# Patient Record
Sex: Male | Born: 1935 | Marital: Single
Health system: Southern US, Community
[De-identification: ages and names within clinical notes are randomized; demographics above are authoritative.]

---

## 2005-07-19 ENCOUNTER — Inpatient Hospital Stay: Payer: Self-pay | Admitting: Internal Medicine

## 2005-07-19 ENCOUNTER — Other Ambulatory Visit: Payer: Self-pay

## 2006-09-06 ENCOUNTER — Emergency Department: Payer: Self-pay | Admitting: Emergency Medicine

## 2008-09-03 ENCOUNTER — Ambulatory Visit: Payer: Self-pay | Admitting: Dermatology

## 2012-02-05 ENCOUNTER — Emergency Department: Payer: Self-pay | Admitting: Emergency Medicine

## 2012-02-05 LAB — ETHANOL
Ethanol %: <0.003 % (ref 0.000–0.080)
Ethanol: <3 mg/dL

## 2012-02-05 LAB — COMPREHENSIVE METABOLIC PANEL
Albumin: 3.9 g/dL (ref 3.4–5.0)
Alkaline Phosphatase: 107 U/L (ref 50–136)
Anion Gap: 7 (ref 7–16)
BUN: 11 mg/dL (ref 7–18)
Bilirubin,Total: 0.6 mg/dL (ref 0.2–1.0)
Calcium, Total: 9.1 mg/dL (ref 8.5–10.1)
Co2: 31 mmol/L (ref 21–32)
EGFR (African American): 56 — ABNORMAL LOW
EGFR (Non-African Amer.): 49 — ABNORMAL LOW
Osmolality: 277 (ref 275–301)
SGPT (ALT): 22 U/L
Total Protein: 7.7 g/dL (ref 6.4–8.2)

## 2012-02-05 LAB — CBC
HCT: 42.7 % (ref 40.0–52.0)
HGB: 14.2 g/dL (ref 13.0–18.0)
MCV: 98 fL (ref 80–100)
Platelet: 221 10*3/uL (ref 150–440)
RBC: 4.37 10*6/uL — ABNORMAL LOW (ref 4.40–5.90)
RDW: 13.8 % (ref 11.5–14.5)
WBC: 9.1 10*3/uL (ref 3.8–10.6)

## 2012-02-05 LAB — URINALYSIS, COMPLETE
Bilirubin,UR: NEGATIVE
Blood: NEGATIVE
Ketone: NEGATIVE
Leukocyte Esterase: NEGATIVE
Nitrite: NEGATIVE
Protein: NEGATIVE
Squamous Epithelial: NONE SEEN
WBC UR: 1 /HPF (ref 0–5)

## 2012-02-05 LAB — DRUG SCREEN, URINE
Amphetamines, Ur Screen: NEGATIVE (ref ?–1000)
Barbiturates, Ur Screen: NEGATIVE (ref ?–200)
MDMA (Ecstasy)Ur Screen: NEGATIVE (ref ?–500)
Methadone, Ur Screen: NEGATIVE (ref ?–300)
Opiate, Ur Screen: NEGATIVE (ref ?–300)
Phencyclidine (PCP) Ur S: NEGATIVE (ref ?–25)
Tricyclic, Ur Screen: NEGATIVE (ref ?–1000)

## 2012-02-05 LAB — TSH: Thyroid Stimulating Horm: 4.29 u[IU]/mL

## 2012-02-05 LAB — TROPONIN I: Troponin-I: 0.05 ng/mL

## 2012-02-05 LAB — ACETAMINOPHEN LEVEL: Acetaminophen: <2 ug/mL

## 2012-02-05 LAB — SALICYLATE LEVEL: Salicylates, Serum: <1.7 mg/dL

## 2012-02-29 ENCOUNTER — Emergency Department: Payer: Self-pay | Admitting: *Deleted

## 2012-02-29 LAB — SALICYLATE LEVEL: Salicylates, Serum: <1.7 mg/dL

## 2012-02-29 LAB — DRUG SCREEN, URINE
Amphetamines, Ur Screen: NEGATIVE (ref ?–1000)
Benzodiazepine, Ur Scrn: NEGATIVE (ref ?–200)
Cannabinoid 50 Ng, Ur ~~LOC~~: NEGATIVE (ref ?–50)
MDMA (Ecstasy)Ur Screen: NEGATIVE (ref ?–500)
Phencyclidine (PCP) Ur S: NEGATIVE (ref ?–25)
Tricyclic, Ur Screen: NEGATIVE (ref ?–1000)

## 2012-02-29 LAB — COMPREHENSIVE METABOLIC PANEL
Alkaline Phosphatase: 89 U/L (ref 50–136)
Anion Gap: 9 (ref 7–16)
Calcium, Total: 8.6 mg/dL (ref 8.5–10.1)
Co2: 27 mmol/L (ref 21–32)
EGFR (African American): >60
EGFR (Non-African Amer.): 53 — ABNORMAL LOW
Osmolality: 284 (ref 275–301)
Potassium: 3.8 mmol/L (ref 3.5–5.1)
SGOT(AST): 44 U/L — ABNORMAL HIGH (ref 15–37)
Sodium: 142 mmol/L (ref 136–145)

## 2012-02-29 LAB — URINALYSIS, COMPLETE
Bilirubin,UR: NEGATIVE
Blood: NEGATIVE
Glucose,UR: NEGATIVE mg/dL (ref 0–75)
Ketone: NEGATIVE
Leukocyte Esterase: NEGATIVE
Ph: 5 (ref 4.5–8.0)
Specific Gravity: 1.011 (ref 1.003–1.030)
Squamous Epithelial: NONE SEEN

## 2012-02-29 LAB — CBC
MCH: 32.7 pg (ref 26.0–34.0)
Platelet: 192 10*3/uL (ref 150–440)
RBC: 4.04 10*6/uL — ABNORMAL LOW (ref 4.40–5.90)
RDW: 13.6 % (ref 11.5–14.5)
WBC: 6.1 10*3/uL (ref 3.8–10.6)

## 2012-02-29 LAB — ACETAMINOPHEN LEVEL: Acetaminophen: <2 ug/mL

## 2012-02-29 LAB — TSH: Thyroid Stimulating Horm: 4.73 u[IU]/mL — ABNORMAL HIGH

## 2012-03-02 LAB — HEPATIC FUNCTION PANEL A (ARMC)
Albumin: 3.4 g/dL (ref 3.4–5.0)
Bilirubin,Total: 0.9 mg/dL (ref 0.2–1.0)
SGPT (ALT): 22 U/L
Total Protein: 7 g/dL (ref 6.4–8.2)

## 2012-03-02 LAB — TSH: Thyroid Stimulating Horm: 5.88 u[IU]/mL — ABNORMAL HIGH

## 2012-03-27 ENCOUNTER — Emergency Department: Payer: Self-pay | Admitting: Emergency Medicine

## 2012-03-27 LAB — CBC
HCT: 42.2 % (ref 40.0–52.0)
MCV: 97 fL (ref 80–100)
Platelet: 220 10*3/uL (ref 150–440)
RDW: 13.7 % (ref 11.5–14.5)

## 2012-03-27 LAB — COMPREHENSIVE METABOLIC PANEL
Albumin: 3.7 g/dL (ref 3.4–5.0)
Alkaline Phosphatase: 97 U/L (ref 50–136)
BUN: 32 mg/dL — ABNORMAL HIGH (ref 7–18)
Bilirubin,Total: 0.4 mg/dL (ref 0.2–1.0)
Co2: 28 mmol/L (ref 21–32)
Creatinine: 1.36 mg/dL — ABNORMAL HIGH (ref 0.60–1.30)
EGFR (African American): 57 — ABNORMAL LOW
EGFR (Non-African Amer.): 49 — ABNORMAL LOW
Osmolality: 281 (ref 275–301)
SGPT (ALT): 36 U/L (ref 12–78)
Sodium: 137 mmol/L (ref 136–145)
Total Protein: 7.2 g/dL (ref 6.4–8.2)

## 2012-03-27 LAB — ETHANOL: Ethanol: <3 mg/dL

## 2012-03-27 LAB — DRUG SCREEN, URINE
Amphetamines, Ur Screen: NEGATIVE (ref ?–1000)
Benzodiazepine, Ur Scrn: NEGATIVE (ref ?–200)
Cannabinoid 50 Ng, Ur ~~LOC~~: NEGATIVE (ref ?–50)
MDMA (Ecstasy)Ur Screen: NEGATIVE (ref ?–500)
Methadone, Ur Screen: NEGATIVE (ref ?–300)
Opiate, Ur Screen: NEGATIVE (ref ?–300)
Phencyclidine (PCP) Ur S: NEGATIVE (ref ?–25)

## 2012-03-27 LAB — URINALYSIS, COMPLETE
Bilirubin,UR: NEGATIVE
Glucose,UR: NEGATIVE mg/dL (ref 0–75)
Ketone: NEGATIVE
Leukocyte Esterase: NEGATIVE
Nitrite: NEGATIVE
RBC,UR: <1 /HPF (ref 0–5)
Specific Gravity: 1.02 (ref 1.003–1.030)

## 2012-06-07 ENCOUNTER — Observation Stay: Payer: Self-pay | Admitting: Internal Medicine

## 2012-06-07 LAB — CBC WITH DIFFERENTIAL/PLATELET
Basophil #: 0 10*3/uL (ref 0.0–0.1)
Basophil %: 0.7 %
Eosinophil #: 0.2 10*3/uL (ref 0.0–0.7)
Eosinophil %: 2.6 %
HGB: 15 g/dL (ref 13.0–18.0)
Lymphocyte %: 38.4 %
MCHC: 34.4 g/dL (ref 32.0–36.0)
Monocyte %: 9.3 %
Neutrophil #: 3 10*3/uL (ref 1.4–6.5)
Neutrophil %: 49 %
Platelet: 218 10*3/uL (ref 150–440)
RBC: 4.47 10*6/uL (ref 4.40–5.90)
RDW: 14.5 % (ref 11.5–14.5)

## 2012-06-07 LAB — URINALYSIS, COMPLETE
Bacteria: NONE SEEN
Blood: NEGATIVE
Glucose,UR: NEGATIVE mg/dL (ref 0–75)
Hyaline Cast: 3
Ketone: NEGATIVE
Nitrite: NEGATIVE
Protein: NEGATIVE
RBC,UR: 1 /HPF (ref 0–5)
Specific Gravity: 1.019 (ref 1.003–1.030)
WBC UR: <1 /HPF (ref 0–5)

## 2012-06-07 LAB — COMPREHENSIVE METABOLIC PANEL
Albumin: 3.7 g/dL (ref 3.4–5.0)
Alkaline Phosphatase: 125 U/L (ref 50–136)
Bilirubin,Total: 0.7 mg/dL (ref 0.2–1.0)
Creatinine: 1.37 mg/dL — ABNORMAL HIGH (ref 0.60–1.30)
EGFR (African American): 56 — ABNORMAL LOW
EGFR (Non-African Amer.): 49 — ABNORMAL LOW
Glucose: 135 mg/dL — ABNORMAL HIGH (ref 65–99)
SGOT(AST): 47 U/L — ABNORMAL HIGH (ref 15–37)
SGPT (ALT): 31 U/L (ref 12–78)
Sodium: 136 mmol/L (ref 136–145)
Total Protein: 7.5 g/dL (ref 6.4–8.2)

## 2012-06-07 LAB — TROPONIN I: Troponin-I: 0.03 ng/mL

## 2012-06-07 LAB — TSH: Thyroid Stimulating Horm: 16.2 u[IU]/mL — ABNORMAL HIGH

## 2012-06-07 LAB — PRO B NATRIURETIC PEPTIDE: B-Type Natriuretic Peptide: 5239 pg/mL — ABNORMAL HIGH (ref 0–450)

## 2012-06-08 DIAGNOSIS — I369 Nonrheumatic tricuspid valve disorder, unspecified: Secondary | ICD-10-CM

## 2012-06-08 LAB — CBC WITH DIFFERENTIAL/PLATELET
Eosinophil %: 2.7 %
HCT: 37 % — ABNORMAL LOW (ref 40.0–52.0)
Lymphocyte #: 2.6 10*3/uL (ref 1.0–3.6)
MCH: 32.2 pg (ref 26.0–34.0)
MCHC: 33.1 g/dL (ref 32.0–36.0)
MCV: 97 fL (ref 80–100)
Monocyte #: 0.7 x10 3/mm (ref 0.2–1.0)
Monocyte %: 9.6 %
Neutrophil #: 3.8 10*3/uL (ref 1.4–6.5)
Platelet: 186 10*3/uL (ref 150–440)
WBC: 7.3 10*3/uL (ref 3.8–10.6)

## 2012-06-08 LAB — BASIC METABOLIC PANEL
BUN: 17 mg/dL (ref 7–18)
Calcium, Total: 8.3 mg/dL — ABNORMAL LOW (ref 8.5–10.1)
Creatinine: 1.08 mg/dL (ref 0.60–1.30)
EGFR (Non-African Amer.): >60
Glucose: 84 mg/dL (ref 65–99)
Osmolality: 278 (ref 275–301)
Potassium: 4.7 mmol/L (ref 3.5–5.1)
Sodium: 139 mmol/L (ref 136–145)

## 2012-06-26 ENCOUNTER — Emergency Department: Payer: Self-pay | Admitting: Emergency Medicine

## 2012-07-07 ENCOUNTER — Emergency Department: Payer: Self-pay | Admitting: Emergency Medicine

## 2012-07-07 LAB — COMPREHENSIVE METABOLIC PANEL
Albumin: 3.1 g/dL — ABNORMAL LOW (ref 3.4–5.0)
Alkaline Phosphatase: 173 U/L — ABNORMAL HIGH (ref 50–136)
Anion Gap: 5 — ABNORMAL LOW (ref 7–16)
BUN: 29 mg/dL — ABNORMAL HIGH (ref 7–18)
Bilirubin,Total: 0.6 mg/dL (ref 0.2–1.0)
Calcium, Total: 9 mg/dL (ref 8.5–10.1)
Creatinine: 1.06 mg/dL (ref 0.60–1.30)
Glucose: 141 mg/dL — ABNORMAL HIGH (ref 65–99)
Osmolality: 275 (ref 275–301)
Potassium: 3.9 mmol/L (ref 3.5–5.1)
SGOT(AST): 29 U/L (ref 15–37)
Sodium: 133 mmol/L — ABNORMAL LOW (ref 136–145)
Total Protein: 7.5 g/dL (ref 6.4–8.2)

## 2012-07-07 LAB — CBC WITH DIFFERENTIAL/PLATELET
Basophil %: 0.4 %
Eosinophil #: 0.2 10*3/uL (ref 0.0–0.7)
Eosinophil %: 2 %
HGB: 15.2 g/dL (ref 13.0–18.0)
Lymphocyte #: 2.3 10*3/uL (ref 1.0–3.6)
Lymphocyte %: 19.5 %
MCH: 33.8 pg (ref 26.0–34.0)
MCV: 97 fL (ref 80–100)
Monocyte #: 1 x10 3/mm (ref 0.2–1.0)
Monocyte %: 8.4 %
Neutrophil %: 69.7 %
Platelet: 348 10*3/uL (ref 150–440)
RBC: 4.5 10*6/uL (ref 4.40–5.90)
WBC: 11.9 10*3/uL — ABNORMAL HIGH (ref 3.8–10.6)

## 2012-07-07 LAB — URINALYSIS, COMPLETE
Blood: NEGATIVE
Glucose,UR: NEGATIVE mg/dL (ref 0–75)
Leukocyte Esterase: NEGATIVE
Nitrite: NEGATIVE
Ph: 5 (ref 4.5–8.0)
Specific Gravity: 1.03 (ref 1.003–1.030)
Squamous Epithelial: NONE SEEN

## 2012-08-13 ENCOUNTER — Ambulatory Visit: Payer: Self-pay | Admitting: Internal Medicine

## 2012-09-07 ENCOUNTER — Inpatient Hospital Stay: Payer: Self-pay | Admitting: Internal Medicine

## 2012-09-07 ENCOUNTER — Ambulatory Visit: Payer: Self-pay

## 2012-09-07 LAB — CK TOTAL AND CKMB (NOT AT ARMC)
CK, Total: 34 U/L — ABNORMAL LOW (ref 35–232)
CK, Total: 14 U/L — ABNORMAL LOW (ref 35–232)
CK-MB: 0.9 ng/mL (ref 0.5–3.6)
CK-MB: 1 ng/mL (ref 0.5–3.6)

## 2012-09-07 LAB — COMPREHENSIVE METABOLIC PANEL
Alkaline Phosphatase: 184 U/L — ABNORMAL HIGH (ref 50–136)
Anion Gap: 10 (ref 7–16)
BUN: 42 mg/dL — ABNORMAL HIGH (ref 7–18)
Chloride: 100 mmol/L (ref 98–107)
Co2: 27 mmol/L (ref 21–32)
Creatinine: 1.65 mg/dL — ABNORMAL HIGH (ref 0.60–1.30)
EGFR (African American): 45 — ABNORMAL LOW
EGFR (Non-African Amer.): 39 — ABNORMAL LOW
Glucose: 114 mg/dL — ABNORMAL HIGH (ref 65–99)
Osmolality: 285 (ref 275–301)
SGOT(AST): 42 U/L — ABNORMAL HIGH (ref 15–37)
SGPT (ALT): 18 U/L (ref 12–78)

## 2012-09-07 LAB — URINALYSIS, COMPLETE
RBC,UR: 3 /HPF (ref 0–5)
Specific Gravity: 1.033 (ref 1.003–1.030)
Squamous Epithelial: NONE SEEN

## 2012-09-07 LAB — CBC
HCT: 43.7 % (ref 40.0–52.0)
MCH: 33.8 pg (ref 26.0–34.0)
MCHC: 33.9 g/dL (ref 32.0–36.0)
MCV: 100 fL (ref 80–100)

## 2012-09-07 LAB — TROPONIN I: Troponin-I: 0.09 ng/mL — ABNORMAL HIGH

## 2012-09-08 LAB — COMPREHENSIVE METABOLIC PANEL
Alkaline Phosphatase: 153 U/L — ABNORMAL HIGH (ref 50–136)
Bilirubin,Total: 1.1 mg/dL — ABNORMAL HIGH (ref 0.2–1.0)
Calcium, Total: 8.1 mg/dL — ABNORMAL LOW (ref 8.5–10.1)
Creatinine: 1.46 mg/dL — ABNORMAL HIGH (ref 0.60–1.30)
EGFR (African American): 52 — ABNORMAL LOW
EGFR (Non-African Amer.): 45 — ABNORMAL LOW
Glucose: 95 mg/dL (ref 65–99)
Osmolality: 289 (ref 275–301)
SGOT(AST): 28 U/L (ref 15–37)
SGPT (ALT): 12 U/L (ref 12–78)
Sodium: 140 mmol/L (ref 136–145)
Total Protein: 5.4 g/dL — ABNORMAL LOW (ref 6.4–8.2)

## 2012-09-08 LAB — CBC WITH DIFFERENTIAL/PLATELET
Basophil %: 0.2 %
Eosinophil #: 0.3 10*3/uL (ref 0.0–0.7)
Eosinophil %: 1.9 %
HCT: 36.3 % — ABNORMAL LOW (ref 40.0–52.0)
HGB: 12.3 g/dL — ABNORMAL LOW (ref 13.0–18.0)
Lymphocyte #: 2 10*3/uL (ref 1.0–3.6)
Lymphocyte %: 14.3 %
MCV: 100 fL (ref 80–100)
Monocyte #: 1 x10 3/mm (ref 0.2–1.0)
Monocyte %: 7 %
Neutrophil #: 10.4 10*3/uL — ABNORMAL HIGH (ref 1.4–6.5)
Neutrophil %: 76.6 %
Platelet: 238 10*3/uL (ref 150–440)
RDW: 15.4 % — ABNORMAL HIGH (ref 11.5–14.5)
WBC: 13.6 10*3/uL — ABNORMAL HIGH (ref 3.8–10.6)

## 2012-09-08 LAB — TSH: Thyroid Stimulating Horm: 0.852 u[IU]/mL

## 2012-09-08 LAB — TROPONIN I: Troponin-I: 0.07 ng/mL — ABNORMAL HIGH

## 2012-09-08 LAB — MAGNESIUM: Magnesium: 1.3 mg/dL — ABNORMAL LOW

## 2012-09-08 LAB — POTASSIUM: Potassium: 3.2 mmol/L — ABNORMAL LOW (ref 3.5–5.1)

## 2012-09-08 LAB — CK TOTAL AND CKMB (NOT AT ARMC): CK, Total: 13 U/L — ABNORMAL LOW (ref 35–232)

## 2012-09-09 LAB — CBC WITH DIFFERENTIAL/PLATELET
Basophil #: 0 10*3/uL (ref 0.0–0.1)
Eosinophil #: 0.4 10*3/uL (ref 0.0–0.7)
Lymphocyte #: 1.9 10*3/uL (ref 1.0–3.6)
MCH: 33.9 pg (ref 26.0–34.0)
MCHC: 34.2 g/dL (ref 32.0–36.0)
MCV: 99 fL (ref 80–100)
Monocyte #: 0.7 x10 3/mm (ref 0.2–1.0)
Monocyte %: 8.6 %
Platelet: 229 10*3/uL (ref 150–440)
RBC: 3.14 10*6/uL — ABNORMAL LOW (ref 4.40–5.90)
WBC: 8.4 10*3/uL (ref 3.8–10.6)

## 2012-09-09 LAB — BASIC METABOLIC PANEL
Anion Gap: 9 (ref 7–16)
BUN: 30 mg/dL — ABNORMAL HIGH (ref 7–18)
Calcium, Total: 7.7 mg/dL — ABNORMAL LOW (ref 8.5–10.1)
Chloride: 109 mmol/L — ABNORMAL HIGH (ref 98–107)
EGFR (Non-African Amer.): >60
Glucose: 84 mg/dL (ref 65–99)
Osmolality: 289 (ref 275–301)
Potassium: 3.5 mmol/L (ref 3.5–5.1)
Sodium: 142 mmol/L (ref 136–145)

## 2012-09-13 ENCOUNTER — Ambulatory Visit: Payer: Self-pay | Admitting: Internal Medicine

## 2012-09-13 LAB — CULTURE, BLOOD (SINGLE)

## 2013-05-11 ENCOUNTER — Emergency Department: Payer: Self-pay | Admitting: Emergency Medicine

## 2013-08-28 IMAGING — CT CT ANGIOGRAPHY NECK
1 of 4 series · 12 of 33 positions shown · IV contrast (APPLIED)
Comparison: none

REASON FOR EXAM: carotid stenosis
COMMENTS:

PROCEDURE:     CT  - CT ANGIOGRAPHY NECK W/CONTRAST  - June 09, 2012  [DATE]
RESULT:     History carotid stenosis.
Comparison Study: Carotid ultrasound of 06/08/2012.

[Series 5: soft tissue · axial · 0.57mm/px · z∈[-339,-114]mm · 12 of 89 slices shown]
[im 7/89  soft-tissue]
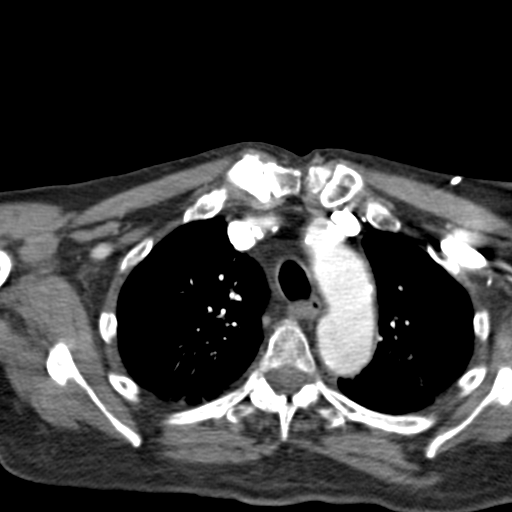
[im 14/89  bone]
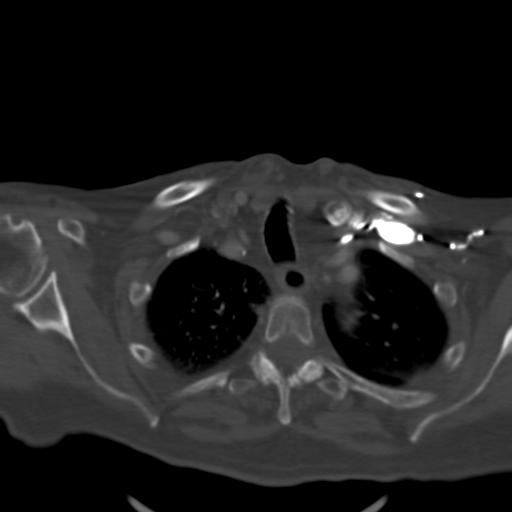
[im 21/89  soft-tissue]
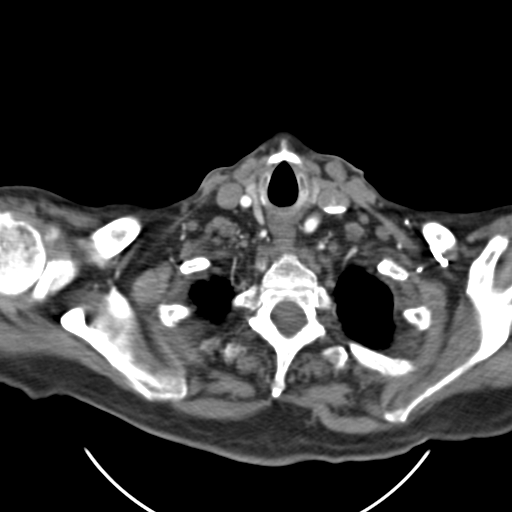
[im 28/89  bone]
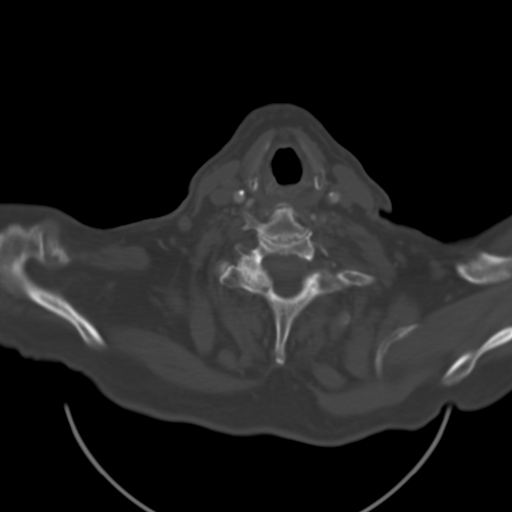
[im 34/89  soft-tissue]
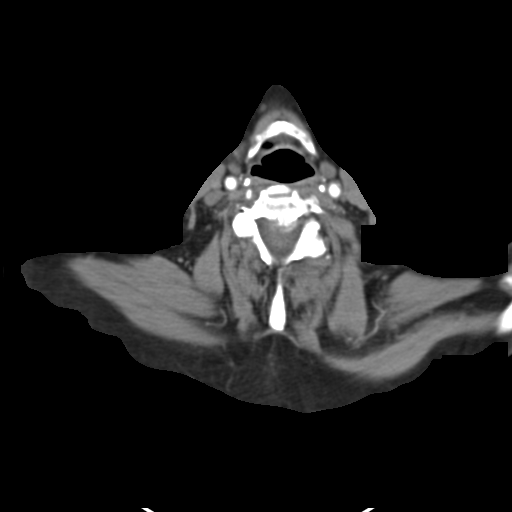
[im 41/89  bone]
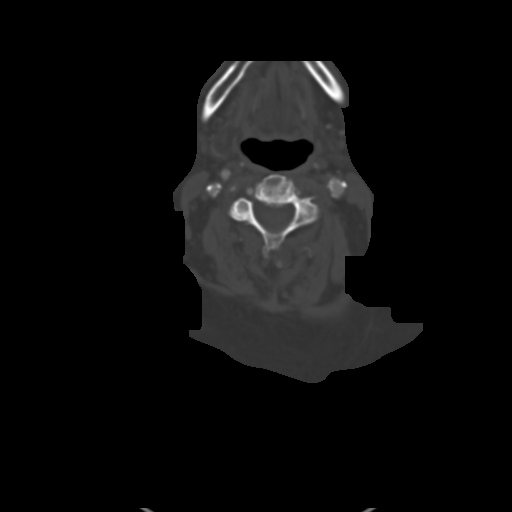
[im 48/89  soft-tissue]
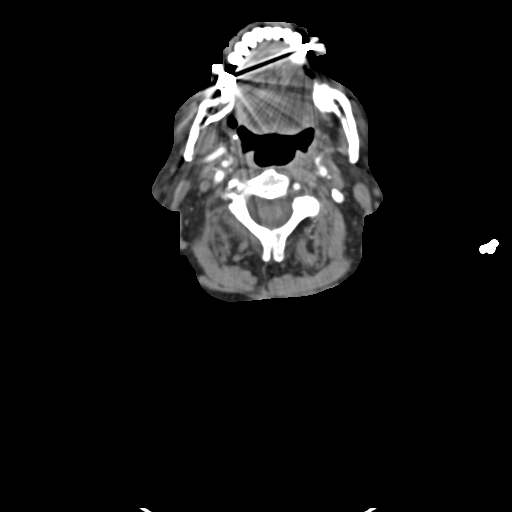
[im 55/89  bone]
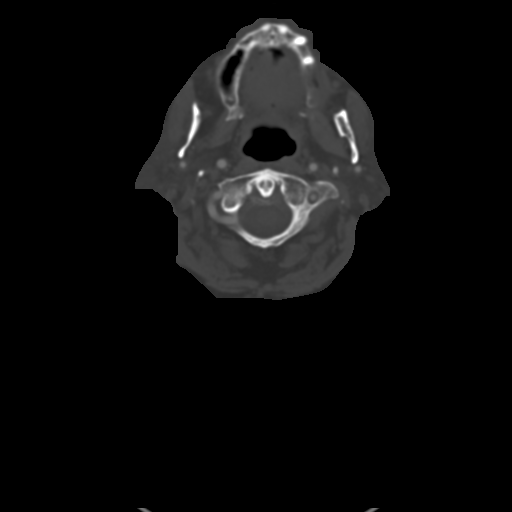
[im 61/89  soft-tissue]
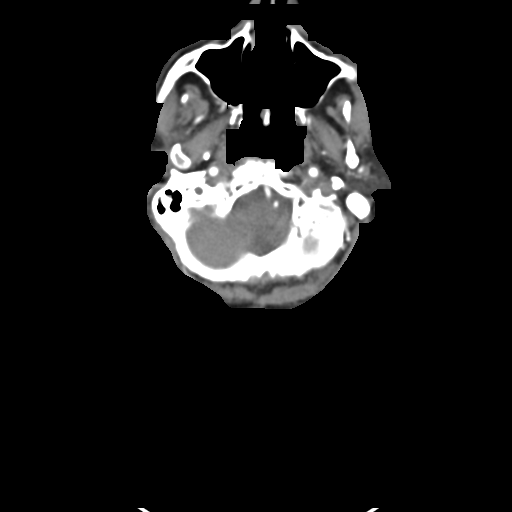
[im 68/89  bone]
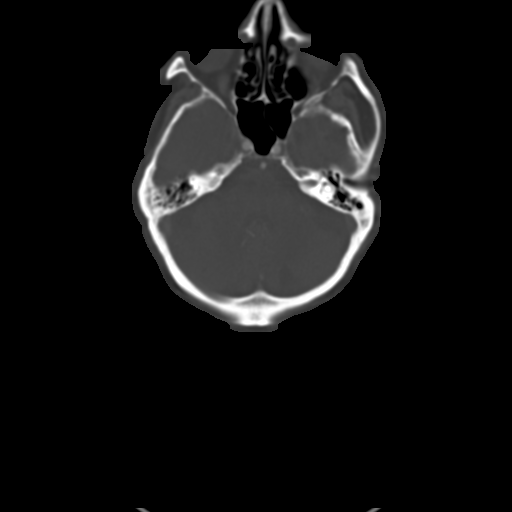
[im 75/89  soft-tissue]
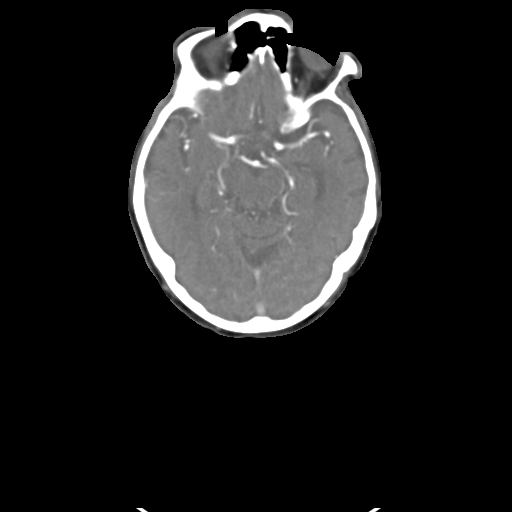
[im 82/89  bone]
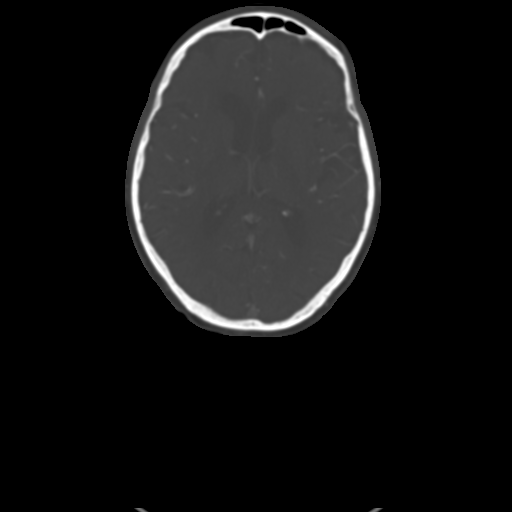

[12 of 33 positions shown; findings below may reference images not displayed]

FINDINGS: Standard CTA performed under cc of [id number]. Axial source
images, MIP images, and volume rendered  images reviewed. Intracranially
anterior and posterior circulations are widely patent.

On the right there is a what appears to be a high-grade calcific stenosis of
the proximal right internal carotid artery approximately one centimeter
distal to the bifurcation . Calcific atherosclerotic vascular disease also
noted at the right carotid bifurcation and more proximal internal carotid
artery as well as in the mid right common carotid artery.

On the there is high-grade near occlusive calcific stenosis present of the
proximal left internal carotid artery just distal to the bifurcation.
Moderate calcific plaque noted at the a bifurcation. Moderate calcific
stenosis noted left mid common and internal carotid artery.

Moderate origin stenosis is present in the right subclavian artery. The
vertebrals are patent.
IMPRESSION: Bilateral high-grade proximal internal carotid artery
stenoses.

## 2013-12-11 DEATH — deceased

## 2014-11-30 NOTE — Consult Note (Signed)
Brief Consult Note: Comments: Psychiatry: I had asked for this patient to please be evaluated by the telepsychiatry consultant since he is clearly not appropriate for admission to our psychiatry service. It has not been done. Patient will be evaluated. We are hoping to get him sent back to the group home from whence he wandered off.  Electronic Signatures: Audery Amellapacs, John T (MD)  (Signed 17-Aug-13 14:28)  Authored: Brief Consult Note   Last Updated: 17-Aug-13 14:28 by Audery Amellapacs, John T (MD)

## 2014-11-30 NOTE — Consult Note (Signed)
Details:    - Psychiatry: Patient seen. Today he is chipper and smiling. Not threatening. Still confused and disorganized and still refusing to go to prior facility. Awaiting referral with [facility] to locked dementia unit. Will add very small dose of risperdal for psycosis and increase aricept to fuller dose. Supportive therapy. No other change of plan.   Electronic Signatures: Audery Amellapacs, John T (MD)  (Signed [phone]-Aug-13 23:47)  Authored: Details   Last Updated: 18-Aug-13 23:47 by Audery Amellapacs, John T (MD)

## 2014-11-30 NOTE — Consult Note (Signed)
PATIENT NAME:  Patrick PetersSTOUT, Rohail A MR#:  [id number] DATE OF BIRTH:  12/08/1930  DATE OF CONSULTATION:  03/29/2012  REFERRING PHYSICIAN:   CONSULTING PHYSICIAN:  Audery AmelJohn T. Rether Rison, MD  IDENTIFYING INFORMATION AND CHIEF COMPLAINT: This is a 81 year old gentleman brought into the Emergency Room under petition because he eloped from the nursing facility he had been sent to and was walking out into traffic and appeared to be endangering himself.   CHIEF COMPLAINT: "They don't tell me nothin'."   HISTORY OF PRESENT ILLNESS: Information obtained from the patient and the chart. The patient is not a very good historian although he does remember that [facility] at [facility]. He cannot tell why he [facility], but he says that after he was there when he was discharged they sent him to hell. He does not know the name of the place, but says that it was filthy and horrible and that he would never stay there. He admits that he tried to elope and leave from that place. The patient is denying any acute mood symptoms. He denies any suicidal ideation, although he does say that he would rather be dead than to go back to live at the place where he was just placed. He denies to me any auditory or visual hallucinations. He does say that he feels like people are out to get him because they keep putting him in locked places. It is not clear if that would be delusional since there is some truth to it. He is not actively threatening any harm to himself but he does say that he would elope from a nursing home if he could.   PAST PSYCHIATRIC HISTORY: Somewhat hard to place together his history. He first came to the Emergency Room here earlier this summer with psychotic symptoms. Apparently he was eventually on the first episode let go home because he had cleared up. He was brought back again with psychotic symptoms later in the summer with delusions, bizarre thinking. At that time he was transferred from our facility to the  [facility].  Evidently he [facility] at [facility] until just yesterday when he was placed in a nursing home from which he promptly eloped. We do not have information about any treatment they provided [facility], although it does not seem like he is on any medication. He denies any past psychiatric history prior to that. Further details are lacking. He denies any history of suicide attempts or history of violence.   MEDICAL HISTORY: The patient has a history of gastric reflux symptoms and seasonal allergies. He does not have high blood pressure. Does not have diabetes.   SOCIAL HISTORY: He has never been married, no children. Evidently he has a guardian who is making decisions for him who is now agreeable to placement of the patient in a locked facility.   REVIEW OF SYSTEMS: The patient denies any depression. Denies suicidal ideation. Denies psychotic symptoms. He does endorse his intention to elope from any facility that he can. Does not have any specific physical complaints right now.   MENTAL STATUS EXAM: Elderly gentleman interviewed in the Emergency Room. He is alert and awake and cooperative with the interview. Good eye contact. Normal psychomotor activity. Speech is a little bit loud but not pressured. Affect is a bit expansive but reactive. Mood is stated as being good. Thoughts are somewhat scattered, but he did not make any grossly bizarre statements. He is a bit paranoid and does  not understand his situation. He knew that he was at a hospital in [place] but did not know the name of it. He knew it was 2013 but did not know the month. He could not describe any of the reasons for why he had been in a hospital or what had recently been done to him. Appears to be cognitively impaired, probably Alzheimer's disease.   LABORATORY RESULTS: Drug screen negative. CBC shows slightly low RBC count. Creatinine was 1.36, BUN 32, AST 52 on the chemistry panel. TSH elevated at 6.56. T4 done as a followup is normal  at 4.8.   ASSESSMENT: This is a 81 year old gentleman with dementia with some psychotic symptoms. We do not know the details of other past psychiatric history, but we do not know of any other previous psychiatric problems. Currently he seems to be having less psychotic symptoms than he was when he was seen earlier in the summer, but still has poor judgment and is unable to take care of himself. The patient needs to be in a more secure facility to prevent him from wandering away or harming himself.   TREATMENT PLAN: He is in the Emergency Room for now under involuntary petition. We are working on trying to get him referred to a locked facility which his guardian has already given consent to. Meanwhile, he will be continued on his current medications which are: Aricept 5 mg per day, Claritin 10 mg per day, Prilosec 20 mg per day, and aspirin 81 mg per day.  Supportive therapy and management.  Referral to a locked facility.   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   SECONDARY DIAGNOSES:  AXIS I: No further.   AXIS II: No diagnosis.   AXIS III: No diagnosis.   AXIS IV: Moderate to severe from lack of social support and confusion.   AXIS V: Functioning at time of evaluation: 40.    ____________________________ Audery Amel, MD jtc:bjt D: 03/29/2012 17:12:08 ET T: 03/30/2012 11:16:05 ET JOB#: [id number]  cc: Audery Amel, MD, <Dictator> Audery Amel MD ELECTRONICALLY SIGNED 03/30/2012 16:48

## 2014-11-30 NOTE — Discharge Summary (Signed)
PATIENT NAME:  Patrick [name], Patrick [name] MR#:  [id number] DATE OF BIRTH:  1930/07/27  DATE OF ADMISSION:  06/07/2012 DATE OF DISCHARGE:  06/10/2012  DISCHARGE DIAGNOSES:  1. Bradycardia.  2. Hypothyroidism. 3. Bilateral high-grade carotid artery stenosis.  4. Syncope.  5. Coronary artery disease.  6. Dementia.  7. Acute kidney injury.   CONSULT: Dr. Wyn Quakerew of Vascular    IMAGING STUDIES:  1. CT angiography of the neck showed bilateral high-grade proximal internal carotid artery stenosis.   2. 2-D echocardiogram which showed ejection fraction of 30 to 35% with impaired LV relaxation.   ADMITTING HISTORY AND PHYSICAL: Please see detailed history and physical dictated on 06/07/2012. In brief, this is an 81 year old patient with dementia, episodes of psychosis with coronary artery disease and coronary artery bypass graft who presented to the Emergency Room complaining of syncope. The patient was found to be bradycardic in the 40's and was admitted to the hospitalist service.   HOSPITAL COURSE:  1. Bradycardia. The patient had [name] TSH level checked which was elevated at 16, was started on levothyroxine at 50 mcg. On the day of discharge the patient's heart rate is in the 60's with no syncope. The patient also had bilateral high-grade stenosis of the carotids for which he was seen by Dr. Wyn Quakerew who has suggested outpatient follow-up and the patient has received an appointment in [name] week to have procedure done for his carotid artery stenosis. The patient's donepezil has also been stopped secondary to bradycardia.  2. Syncope secondary to bradycardia, resolved.  3. Chronic systolic congestive heart failure. The patient does have ejection fraction of 30 to 35%. Beta-blocker, ACE inhibitors cannot be started secondary to his bradycardia and borderline low blood pressure. The patient will need high normal blood pressures considering his bilateral carotid stenosis until he gets his procedures done.   On the day of  discharge, the patient's temperature is 98.4, pulse 63, blood pressure 101/54, saturating 95% on room air. He is being discharged back to his assisted living facility in stable condition to follow-up with Dr. Wyn Quakerew and primary care physician.   DISCHARGE MEDICATIONS:  1. Aspirin 81 mg oral once [name] day.  2. Celexa 10 mg oral once [name] day.  3. Omeprazole 20 mg oral once [name] day.  4. Loratadine 10 mg oral once [name] day.  5. Docusate sodium 100 mg oral once [name] day as needed for constipation.  6. Acetaminophen/oxycodone 325/5 one tablet every six hours as needed for pain.  7. Levothyroxine 50 mcg oral once [name] day.  8. Hydroxyzine 1 tablet oral every eight hours as needed for anxiety.   DISCHARGE INSTRUCTIONS:  1. Follow-up with Dr. Wyn Quakerew in [name] week for bilateral carotid artery stenosis.  2. Cardiac diet.  3. Activity as tolerated with assistance.   TIME SPENT: Time spent today on this discharge dictation along with coordinating care and counseling of the patient was 35 minutes.   ____________________________ Molinda BailiffSrikar R. Alexsandria Kivett, MD srs:drc D: 06/10/2012 14:03:41 ET T: 06/11/2012 11:14:23 ET JOB#: [id number]  cc: Wardell HeathSrikar R. Kerrie Latour, MD, <Dictator> Orie FishermanSRIKAR R Aldous Housel MD ELECTRONICALLY SIGNED 06/11/2012 23:11

## 2014-11-30 NOTE — H&P (Signed)
PATIENT NAME:  Patrick [name], PORE MR#:  [id number] DATE OF BIRTH:  1930/03/04  DATE OF ADMISSION:  06/07/2012  REASON FOR ADMISSION: Syncopal episode.   PRIMARY CARE PHYSICIAN: Nonlocal. Patient at  skilled nursing facility.  REFERRING PHYSICIAN: Dr. Darnelle Catalan  HISTORY OF PRESENT ILLNESS: Patrick [name] is a very nice 81 year old gentleman who has history of significant coronary artery disease, previous episodes of psychotic hallucinations and agitation for what the patient is being admitted here for psychiatric treatment. The patient is currently at skilled nursing facility and apparently today during breakfast the patient had a syncopal episode. The patient describes the episode as just eating at the end of his breakfast everything turned black and the patient passed out. He was sitting down and he was already sitting down for a while for what the patient didn't fall on the floor, didn't hit his head, just collapsed into the chair. He was not trying to stand up, he was not trying to move. He was just simply sitting down. The patient states that he was down for a couple of minutes and he woke up completely without any symptomatology. The patient did not feel dizzy, did not have any chest pain, headache or shortness of breath. Patient was brought to [facility] clinic where he was observed. There was no major changes in his neurologic exam for what the patient did not have a CT of the head. Overall he is doing okay. He is asymptomatic but concerned about this episode. Apparently the patient had an episode two months ago that was similar to this where the patient passed out. At that moment he was not doing any major activity here.   REVIEW OF SYSTEMS: 12 system review of systems is done. All negative except for mentioned in the history of present illness. CONSTITUTIONAL: Denies any fever. He has been feeling very fatigued for the past several months. He denies any significant pain, weight loss or weight gain.  EYES: No blurry vision. No double vision. No glaucoma. No cataracts. ENT: Denies any tinnitus, ear pain, postnasal drainage. He does have allergies but at this moment has been well controlled. RESPIRATORY: No cough. No wheezing. No hemoptysis. No dyspnea. No asthma. No chronic obstructive pulmonary disease. No pneumonia. He has never smoked. CARDIOVASCULAR: Patient denies any chest pain. He has gastroesophageal reflux disease that is very significant all the time and he takes omeprazole. No orthopnea, no edema, no varices or varicose veins. He has had a syncopal episode in the past as mentioned above. GASTROINTESTINAL: No nausea, vomiting, or diarrhea. He does have constipation. He was taking stool softeners and actually that turned into having a lot of loose stool for what the patient stopped. He has not had much trouble with constipation lately. No jaundice. No hemorrhoids. No hernias. GENITOURINARY: No dysuria. No hematuria. No frequency. No incontinence. Patient has ED. ENDOCRINE: No polyuria, polydipsia, polyphagia. No heat intolerance but the patient gets really cold all the time. He denies any prior thyroid problems although his TSH is very high today. He denies any cold or heat intolerance. 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 No other lesions at this moment. No rashes. HEMATOLOGIC/LYMPHATIC: No anemia. No easy bruising. No swollen glands. NEUROLOGIC: No numbness, weakness, dysarthria epilepsy, vertigo, ataxia. Positive for dementia, but it is mild. It is vascular dementia. He has been told that he had small strokes in the past but no measured strokes with symptomatology. No transient ischemic attacks. PSYCHIATRIC: Patient is being admitted  for psychoses due to dementia with psychotic features. He denies any bipolar disorder.   PAST MEDICAL HISTORY:  1. History of myocardial infarction.  2. Coronary artery disease.  3. Cerebrovascular accident, apparently small basal  dementia.  4. Dyslipidemia, not able to tolerate statins.  5. Previous psychotic episodes.  6. Depression.  7. Gastroesophageal reflux disease.  8. Seasonal allergies.  9. Mild constipation.  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  11. Rectal cancer, now resolved.   PAST SURGICAL HISTORY: Positive for coronary artery bypass graft he says 50 years ago. He denies any other significant surgeries other than removal of lesion on his right arm.   SOCIAL HISTORY: Patient is residing in a skilled nursing facility. He has never been married. He does not have children. He has a girlfriends who lives in a different skilled nursing facility and there is what he calls a stepdaughter who helps taking care of her decision. Her last name is Engineer, productionBaker her phone number was apparently [phone] although that number is not working. The other numbers mentioned on the records are not working numbers. Patient has never smoked. He does not drink. He didn't use any drugs in the past other than prescription drugs.    FAMILY HISTORY: Negative for coronary artery disease. Positive for congestive heart failure on his mother and severe asthma on his father.   CURRENT MEDICATIONS:  1. Omeprazole 20 mg daily.  2. Nystatin cream for the groin.  3. Tylenol p.r.n.  4. Maalox p.r.n.  5. Loratadine 10 mg daily.  6. Hydroxyzine p.r.n. agitation.  7. Donepezil 5 mg once daily.  8. Docusate 100 mg as needed.  9. Celexa 10 mg once daily.  10. Aspirin 81 mg daily.   ALLERGIES: Positive allergies to Lipitor, Zocor with muscle weakness and penicillin with rash.    PHYSICAL EXAMINATION:  VITAL SIGNS: Blood pressure 110/40, pulse in between 48 and 53, temperature 97.3, respiratory rate 18.   GENERAL: Patient is alert, oriented x3, no acute distress. No respiratory distress. He is very pleasant. He is very sharp at this moment. He has a diagnosis of vascular dementia, although right now again he is very sharp, able to give a good history in a reliable  way.   HEENT: His pupils are equal and reactive. Extraocular movements are intact. Mucosa is moist. No oral lesions. No oropharyngeal exudates. Patient able to close both eyes and smile without any drooping of his oral commissures. Sensation on the face is maintained.   NECK: Supple. No JVD. No thyromegaly. No adenopathy. Positive carotid bruit on the right side as well as a significant systolic ejection murmur that is radiating from the aortic auscultating point and down to his right neck. Trachea central. No palpable masses on the neck.   CARDIOVASCULAR: Regularly irregular, slow rate in the 40s to 50s, systolic ejection murmur of 4/6 on the aortic auscultated point in right carotid, but they are very distant heart tones in the rest of the auscultatory points. No displacement of apical heart tones. No thrills.   LUNGS: Clear without any wheezing or crepitus. No use of accessory muscles. No dullness to percussion.   ABDOMEN: Soft, nontender, nondistended. No hepatosplenomegaly. No masses. Bowel sounds are positive. No hepatic stigmata.   GENITAL: Negative for external lesions. No significant erythema on groins. No palpable lymph nodes on groins.   EXTREMITIES: No edema, no cyanosis, no clubbing. Pulses +2. Capillary refill less than 3. Sensation is maintained distally.   NEUROLOGIC: Cranial nerves II through XII intact.  No focal findings. Strength seems to be equal, 5/5 in four extremities.   MUSCULOSKELETAL: Negative for significant joint deformity or joint effusions.   SKIN: Without any significant rashes or petechiae.   PSYCHIATRIC: Negative for anxiety or agitation at this moment. Patient is alert and oriented x3. Normal speech.   LABORATORY, DIAGNOSTIC AND RADIOLOGICAL DATA: Glucose 135, BUN 15, creatinine 1.37, sodium 136, potassium 4.5, AST 47. TSH 16.2, in the past TSH has been borderline high around 4 to 6 but this jumped to 16 now.   Hemoglobin 15, white count 6.2. T3 uptake in  the past has been normal around 35% and T4 has been normal at 4.8, on the low side. EKG: Atrial fibrillation with slow rate.   Chest x-ray: No significant infiltrates or fluid overload. There are wires from previous coronary artery bypass graft. Previous CT of the head without contrast done in July showed no acute abnormalities.   ASSESSMENT AND PLAN: This is 81 year old gentleman with history of myocardial infarction, coronary artery bypass graft, vascular dementia, apparently a previous small cerebrovascular accident, gastroesophageal reflux disease, history of rectal cancer and right arm cancer now resolved who came here with episode of blacking out.  1. Syncope. Patient has a couple of things that might be important to consider about his syncope. He is very functioning but due to the episodes of psychosis he has been put in the hospital. He has recently been started on donepezil which is a medication that could cause bradycardia for what we are stopping it. The other thing is he has in the past been subclinical hypothyroidism without any treatment and now his TSH is 16. I do not think there is any reason to check T3, T4 since T4 in the past was borderline low. I think at this moment he just need to be started on thyroid replacement, which we are going to do. We are going to do an echocardiogram due to his significant coronary artery disease history and the fact that he has a significant murmur has not been mentioned in the past. He knows that he has a murmur but now it is radiating into the neck. He has also carotid bruits for what I am going to get ultrasound Doppler of the neck for carotid Doppler. He is overall stable with normal blood pressure. I think there is no need of cardiology consultation right now unless there is any significant changes or depending on the findings on the echocardiogram. Will consult cardiology as needed tomorrow. Continue aspirin.  2. Coronary artery disease. Patient has  history of coronary artery bypass graft. Continue aspirin. He cannot take statins due to muscle weakness. He is not on a beta blocker. He has never had hypertension for what he cannot tolerate blood pressure medications and now with his bradycardia a beta blocker would be contraindicated anyway. He is not on an ACE inhibitor either due to borderline low blood pressure.  3. History of dementia. Stop donepezil and start Namenda instead. Since the patient has vascular dementia this medication might prevent evolution of the symptoms.  4. Constipation. Patient will like to hold on his medications because that make him have significant loose stools. He will take them p.r.n.  5. Gastroesophageal reflux disease. Continue omeprazole.  6. Other medical problems seem to be stable. Deep vein thrombosis prophylaxis we are going to do Lovenox on a renal dose.  7. Acute kidney injury. Patient has bump on his creatinine for what we are going to keep him on very  low dose of IV fluids, only 1 liter 50 mL/h.  8. Patient is a FULL CODE.   TIME SPENT: About 50 minutes with this patient.    ____________________________ Felipa Furnace, MD rsg:cms D: 06/07/2012 13:20:34 ET T: 06/07/2012 14:04:40 ET JOB#: [id number]  cc: Felipa Furnace, MD, <Dictator> Suhaylah Wampole Juanda Chance MD ELECTRONICALLY SIGNED 06/07/2012 18:10

## 2014-11-30 NOTE — Consult Note (Signed)
Brief Consult Note: Diagnosis: Psychosis NOS, Cognitive disorder NOS.   Patient was seen by consultant.   Consult note dictated.   Recommend further assessment or treatment.   Orders entered.   Discussed with Attending MD.   Comments: Mr. Valentina LucksStout has no psychiatric history except cognitive decline. DSS is involved as there is a feeling that he is unable to care for himself. Guardianship hearing to obtain temporary guardianship on Monday. he was brought to the hospital initially for chest pain but it soo became obvious the he is paranoid and delusional.   PLAN: 1. The patient has [facility] benefits. They have beds and the patient will be likely trasferred there.  2. I will start Lisinpril and Niacin.  3.I will start low dose haldol for psychosis and ambien for sleep..  Electronic Signatures: Kristine LineaPucilowska, Rosalene Wardrop (MD)  (Signed 19-Jul-13 19:48)  Authored: Brief Consult Note   Last Updated: 19-Jul-13 19:48 by Kristine LineaPucilowska, Edinson Domeier (MD)

## 2014-11-30 NOTE — Consult Note (Signed)
Details:    - Psychiatry: PAtient seen. Elderly man with worsening confusion and psychotic symptoms not manageable outside hosp. Tolerating meds well. We [facility] and are awaiting availibility of a bed for transfer. Patient remains calm without complaint. No change to treatment plan   Electronic Signatures: Audery Amellapacs, Latoshia Monrroy T (MD)  (Signed 21-Jul-13 00:02)  Authored: Details   Last Updated: 21-Jul-13 00:02 by Audery Amellapacs, Janiqua Friscia T (MD)

## 2014-11-30 NOTE — Consult Note (Signed)
PATIENT NAME:  Patrick [name], Patrick [name] MR#:  [id number] DATE OF BIRTH:  12/08/1930  DATE OF CONSULTATION:  02/29/2012  REFERRING PHYSICIAN:  Olivia Mackie, MD CONSULTING PHYSICIAN:  Jolanta B. Pucilowska, MD  REASON FOR CONSULTATION: To evaluate psychotic patient.   IDENTIFYING DATA: Patrick [name] is a 81 year old male with no past psychiatric history except for cognitive decline.   CHIEF COMPLAINT: Will you send me home.   HISTORY OF PRESENT ILLNESS: Patrick [name] was seen in the Emergency Room on 06/26 for psychotic disorganization. There was a feeling that the patient has poor judgment and is unable to take care of himself. He started behaving strangely when his girlfriend moved to another assisted living facility. The patient has been harassing her with phones calls and recently the girlfriend and her family took a restraining order on him. There is no history of aggression, just two frequent phone calls. DSS became involved and guardianship hearing to obtain a temporary guardianship is scheduled for Monday, July 22. The patient came to the Emergency Room again complaining of chest pain. It soon became evident that he is floridly psychotic, believing that teenage girls are buried under his window. He actually has been calling police every day to complain about the corpses. He also believes that he is a Community education officer as he won the [employer] and is given seven thousand dollars each month. He feels that the government steals his money. With me, he is pleasant, very engaging, polite, and sensible. He tells me about his life, his family and his friendship with aforementioned woman. He has another friend at the place he lives but the other woman is not doing well either. The patient explains that he is the youngest of his siblings. Everybody else is deceased and he has no family around except for two nephews who live in [place]. He is engaged in church activities which according to Dr. Providence Crosby dictation explains  some of his preoccupation with religion. It could be completely within his denomination's belief.   PAST PSYCHIATRIC HISTORY: The patient adamantly denies. No hospitalizations. No medication. No suicide attempts. He does not remember talking to Dr. Jeanie Sewer a couple of weeks ago. He denies any symptoms of depression or psychosis. We cannot rule out symptoms of bipolar hypomania. He denies alcohol, illicit drugs, or prescription pill abuse.   FAMILY PSYCHIATRIC HISTORY: Unknown.   PAST MEDICAL HISTORY:  1. Hypertension. 2. Coronary artery disease with seven heart attacks in the past.   ALLERGIES: Zocor, Lipitor, penicillin.   MEDICATIONS ON ADMISSION:  1. Zyrtec 10 mg daily.  2. During previous visit to the Emergency Room, he was taking Colestipol one gram 6 tablets twice daily. 3. Dipyridamole 75 mg twice daily.  4. Lisinopril 5 mg daily.  5. Multivitamin daily.  6. Tylenol PM at bedtime. 7. Niacin extended release daily.   His medications are provided through the [place] [facility].   SOCIAL HISTORY: He served in the [employer] at the end of World War II. He was not in combat. He has full VA benefits. 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 As above, he has no family. He was separated with a woman he cared about and now is unable to see her or even talk to her on the phone due to excessive phone calls. DSS is involved and will eventually obtain guardianship. The patient lives independently in a supervised apartment. He does not cook. He eats out in [employer] cafeteria and one of the steak houses.   REVIEW OF SYSTEMS: CONSTITUTIONAL: No fevers or chills. No weight changes. EYES: No double or blurred vision. ENT: No hearing loss. RESPIRATORY: No shortness of breath or cough. CARDIOVASCULAR: Positive for chest pain. GASTROINTESTINAL: No abdominal pain, nausea, vomiting, or diarrhea. GU: No incontinence or frequency. ENDOCRINE: No heat or cold intolerance. LYMPHATIC: No  anemia or easy bruising. INTEGUMENTARY: No acne or rash. MUSCULOSKELETAL: No muscle or joint pain. NEUROLOGIC: No tingling or weakness. PSYCHIATRIC: See history of present illness for details.   PHYSICAL EXAMINATION:  VITAL SIGNS: Blood pressure 127/64, pulse 63, respirations 18, temperature 98.4.   GENERAL: This is a well-developed male, elderly gentleman, in no acute distress. The rest of the physical examination is deferred to his primary attending.   LABORATORY DATA: Chemistries are within normal limits. Blood alcohol level is 0. LFTs within normal limits except for AST of 44. TSH 4.73. Urine tox screen negative for substances. CBC within normal limits. Urinalysis is not suggestive of urinary tract infection. Serum acetaminophen and salicylates are low.   MENTAL STATUS EXAMINATION: The patient is alert and oriented to person, place, and situation. He knows it is July 2013. He is pleasant, polite, engaging and cooperative. He is appropriately funny. He is wearing hospital scrubs. He is examined in the Emergency Room. He maintains good eye contact. His speech is of normal rhythm, rate, and volume. He is quite talkative. Mood is fine with full affect. Thought processing seems logical and goal oriented. Thought content: He denies suicidal or homicidal ideation. He is paranoid and grandiose. He denies auditory or visual hallucinations. His cognition is grossly intact. He registers three out of three and recalls three out of three objects after five minutes. He can name the current president. I did not check his counting or spelling. His insight and judgment are questionable.   SUICIDE RISK ASSESSMENT: This is a patient with no past psychiatric history, but possibly cognitive decline and psychosis, who developed his symptoms in the context of major loss when his girlfriend moved away to another facility. He denies thoughts of hurting himself or others. He is very positive. He has friends at the facility as  well as church friends. He considers going to visit his nephews in [place].   DIAGNOSES:  AXIS I:  1. Psychosis, not otherwise specified.  2. Cognitive disorder, not otherwise specified.   AXIS II: Deferred.   AXIS III:  1. Hypertension.  2. Dyslipidemia.  3. Coronary artery disease.    AXIS IV: Mental and physical illness, major loss, cognitive decline, primary support, medication noncompliance.   AXIS V: GAF 25.   PLAN:  1. [facility] and apparently they have beds. We made a referral. The patient will likely be accepted there shortly. 2. I will start him on Lisinopril.  3. We will offer low dose Haldol for agitation if necessary.  4. We hope that the patient will be transferred to another facility. If not, I will follow up.    ____________________________ Braulio ConteJolanta B. Jennet MaduroPucilowska, MD jbp:ap D: 02/29/2012 18:28:21 ET T: 03/01/2012 10:20:44 ET JOB#: [id number]  cc: Jolanta B. Jennet MaduroPucilowska, MD, <Dictator> Shari ProwsJOLANTA B PUCILOWSKA MD ELECTRONICALLY SIGNED 03/04/2012 4:42

## 2014-12-03 NOTE — H&P (Signed)
PATIENT NAME:  Patrick PetersSTOUT, Patrick [name] MR#:  [id number] DATE OF BIRTH:  12/08/1930  DATE OF ADMISSION:  09/07/2012  REFERRING PHYSICIAN: Dr. Mindi JunkerGottlieb.   FAMILY PHYSICIAN: None local.   REASON FOR ADMISSION: Urinary retention associated with [name] hypotension, diarrhea, and abdominal pain.   HISTORY OF PRESENT ILLNESS: The patient is [name] 81 year old male from [name] nursing facility who has [name] history of peripheral vascular disease with high-grade carotid stenosis as well as underlying coronary artery disease, dementia and previous MI. The patient has chronic dementia and psychosis. He presents to the Emergency Room from the skilled nursing facility with altered mental status and lethargy. He is complaining of abdominal pain. He has had some diarrhea and vomiting at the nursing facility. In the Emergency Room, the patient was found to be dehydrated and hypotensive. He had [name] high white count and his troponin was mildly elevated. He was unable to pass any urine. He is now admitted for further evaluation.   PAST MEDICAL HISTORY: 1.  ASCVD, status post MI.  2.  Peripheral vascular disease with carotid stenosis.  3.  Hyperlipidemia.  4.  Psychosis.  5.  Depression.  6.  Dementia.  7.  Previous stroke.  8.  Chronic constipation.  9.  History of rectal cancer.  10.  Status post CABG.   MEDICATIONS: 1.  Aspirin 81 mg p.o. daily.  2.  Omeprazole 20 mg p.o. daily.  3.  Colace 100 mg p.o. daily.  5.  Synthroid 50 mcg p.o. daily. 6.  Namenda 10 mg p.o. daily.  7.  Nystatin cream b.i.d. to groin.  8.  Tramadol 50 mg p.o. every 6 hours p.r.n. pain.  9.  Effexor-XR 75 mg p.o. daily.   ALLERGIES: ZOCOR, LIPITOR AND PENICILLIN.   SOCIAL HISTORY: The patient resides in [name] skilled [facility] facility. He is not married. No children. No history of alcohol or tobacco abuse.   FAMILY HISTORY: Negative for coronary artery disease. Positive for asthma and CHF.   REVIEW OF SYSTEMS:  Unable to obtain from the patient.   PHYSICAL  EXAMINATION: GENERAL: The patient is lethargic, confused and appears chronically ill.  VITAL SIGNS: Remarkable for [name] blood pressure of 95/58, with [name] heart rate of 88 and [name] respiratory rate of 17. He is afebrile.  HEENT: Normocephalic, atraumatic. Pupils equally round and reactive to light and accommodation. Extraocular movements are intact. Sclerae are not icteric. Conjunctivae are clear. Oropharynx is dry but clear.  NECK: Supple without JVD. No adenopathy or thyromegaly is noted. There are bruits noted bilaterally. Right greater than left.  LUNGS: Revealed decreased breath sounds with basilar rhonchi.  CARDIAC: Regular rate and rhythm with [name] normal S1 and S2. There is [name] 4/6 systolic murmur noted at the upper sternal border. No rubs or gallops are present.  ABDOMEN: Soft, but tender in the lower abdomen bilaterally. It is somewhat distended. Hypoactive bowel sounds. No organomegaly or masses were appreciated.  EXTREMITIES: Without clubbing, cyanosis or edema. Pulses are 1+ bilaterally.  SKIN: Warm and dry without lesions. Tinea cruris was noted on exam.  NEUROLOGIC: Cranial nerves II through XII grossly intact. Deep tendon reflexes were symmetric. Motor and sensory examination is nonfocal.  PSYCHIATRIC: The patient answer questions intermittently, but was not oriented to person, place or time.   LABORATORY, DIAGNOSTIC AND RADIOLOGICAL DATA: Abdominal ultrasound was nondiagnostic.   Head CT revealed chronic changes with no acute abnormality.   Troponin was 0.14 with [name] total CK of 34 and MB of 0.9. White  count was 21.2 with [name] hemoglobin of 14.8. Glucose 115 with [name] BUN of 42 and [name] creatinine 1.65 with [name] GFR of 39.   ASSESSMENT: 1.  Urinary retention.  2.  Abdominal pain of unclear etiology.  3.  Dehydration with hypotension.  4.  Renal insufficiency.  5.  Leukocytosis.  6.  Elevated troponin.  7.  Arteriosclerotic vascular disease status post coronary artery bypass graft.    8.  Peripheral  vascular disease with carotid stenosis.   PLAN: The patient will be admitted to the floor with telemetry as [name] full code. We will follow serial cardiac enzymes and obtain an echocardiogram. We will also consult cardiology because of his elevated troponin and significant cardiac history. We will obtain [name] urology consult urgently because of his urinary retention. We will also check [name] renal ultrasound. We will send off blood and urine cultures and begin IV fluids with IV antibiotics. We will continue his thyroid medication at this time and check [name] TSH. We will obtain consults from physical therapy and case manager for placement. Clear liquid diet for now. Follow up routine labs in the morning. Further treatment and evaluation will depend upon the patient's progress.   TOTAL TIME SPENT: 50 minutes.    ____________________________ Duane Lope Judithann Sheen, MD jds:cc D: 09/07/2012 15:14:11 ET T: 09/07/2012 16:04:11 ET JOB#: [id number]  cc: Duane Lope. Judithann Sheen, MD, <Dictator> JEFFREY Rodena Medin MD ELECTRONICALLY SIGNED 09/08/2012 8:08

## 2014-12-03 NOTE — Consult Note (Signed)
PATIENT NAME:  Patrick PetersSTOUT, Antoney A MR#:  [id number] DATE OF BIRTH:  12/08/1930  DATE OF CONSULTATION:  09/07/2012  REFERRING PHYSICIANS:  Glennie IsleSheryl Gottlieb, MD / Aram BeechamJeffrey Sparks, MD CONSULTING PHYSICIAN:  Yareth Macdonnell D. Tyrianna Lightle, MD  INDICATION: Syncope and hypotension.  HISTORY OF PRESENT ILLNESS: Mr. Patrick [name] is a 81 year old white male from a nursing home with history of peripheral vascular disease, carotid stenosis, coronary artery disease, dementia and previous MIs. He has had chronic dementia and some psychosis. He came to Emergency Room with altered mental status and lethargy. Complained of abdominal pain. He had some diarrhea and vomiting at the nursing facility and found to be dehydrated and hypotensive and found to have an elevated troponin. He had a syncopal episode while urinating so cardiology was recommended.   REVIEW OF SYSTEMS: Syncope and blackout spell, probably related to his urinary symptoms. No weight loss. No weight gain. No hemoptysis or hematemesis. No bright red blood per rectum day. No vision change or hearing change. Denies sputum production. Denies cough.   PAST MEDICAL HISTORY:  1. Arteriosclerotic cardiovascular disease  2. Myocardial infarction.  3. Peripheral vascular disease. 4. Carotid stenosis. 5. Hyperlipidemia. 6. Psychosis. 7. Depression. 8. Dementia. 9. Previous stroke.  10. Constipation. 11. Rectal cancer. 12. Coronary artery disease.   PAST SURGICAL HISTORY: Coronary artery bypass surgery.     MEDICATIONS: 1. Aspirin 81 mg a day.  2. Omeprazole 20 mg a day.  3. Colace 100 mg a day. 4. Synthroid 50 mcg a day. 5. Namenda 10 mg a day.  6. Nystatin cream twice a day to groin area.  7. Tramadol 50 q. 6 hours p.r.n.  8. Effexor-XR 75 mg a day.   ALLERGIES: ZOCOR, LIPITOR AND PENICILLIN.   FAMILY HISTORY: Negative for coronary artery disease. Positive for asthma, CHF.  SOCIAL HISTORY: Not married. No children. No history of alcohol or smoking abuse. Lives  in a nursing home.   PHYSICAL EXAMINATION: VITAL SIGNS: Blood pressure 100/60, pulse 88, respiratory rate   and afebrile.  HEENT: Normocephalic, atraumatic. Pupils equal and reactive to light.  NECK: Supple. No significant JVD, bruits or adenopathy.  LUNGS: Clear to auscultation and percussion. No significant wheeze, rhonchi or rales.  HEART: Regular rate and rhythm. Positive bowel sounds. No rebound, guarding or tenderness.  EXTREMITIES: Within normal limits. NEUROLOGIC: Exam is intact. SKIN: Exam is normal  PSYCH: Demented and confused, but alert.  LABS/RADIOLOGIC STUDIES: Abdominal ultrasound, nondiagnostic.   CT of head: Atrophy but no acute changes.   Troponin 0.14. CK 34 and MB 0.9. White count 21,000 and hemoglobin 14.8. Glucose 115, BUN 42 and creatinine 1.65.   ASSESSMENT: 1. Syncope. 2. Elevated white count. 3. Abdominal pain. 4. Elevated troponin, possible non-Q-wave myocardial infarction.  5. Renal insufficiency.  6. Coronary artery disease. 7. Peripheral vascular disease. 8. Arteriosclerotic vascular disease.  9. Urinary retention.  10. Dehydration.  11. Hypotension.   PLAN: Agree with treatment. Agree with admission. Rule out for myocardial infarction. Follow up troponins. Follow up EKGs. Short-term anticoagulation. Follow up renal insufficiency. Consider echocardiogram. Consult urology for urinary retention. Consider antibiotics. Follow up TSH. Gentle hydration. I do not recommend invasive evaluation from a cardiac standpoint at this stage. We will continue to follow and see if the white count resolves or see if source of infection has materialized and treat the patient medically.  ____________________________ Bobbie Stackwayne D. Juliann Paresallwood, MD ddc:sb D: 09/08/2012 13:30:00 ET T: 09/08/2012 14:19:16 ET JOB#: [id number]  cc: Lil Lepage D. Juliann Paresallwood, MD, <Dictator> Sajid Ruppert D  Kevyn Boquet MD ELECTRONICALLY SIGNED 10/03/2012 16:17

## 2014-12-03 NOTE — Discharge Summary (Signed)
PATIENT NAME:  Patrick PetersSTOUT, Patrick [name] MR#:  [id number] DATE OF BIRTH:  12/08/1930  DATE OF ADMISSION:  09/07/2012 DATE OF DISCHARGE:  09/11/2012  ADDENDUM  DISCHARGE MEDICATIONS: (New and final) 1. Mag-Al Plus 30 mL q. 4 p.r.n.  2. Aspirin 81 mg daily.  3. Ultram 50 mg q. 6 p.r.n.  4. Nystatin apply to affected area as needed.  5. Magnesium oxide 400 b.i.d.  6. Remeron 15 mg at bedtime.  7. Zofran 4 mg q. 4 p.r.n.  8. Tylenol Extra Strength 500 mg 2 tablets t.i.d.  9. Omeprazole 20 mg daily.  10. Docusate 100 mg daily p.r.n.  11. Donepezil 5 mg at bedtime. 12. Claritin 10 mg daily.  13. Memantine or Namenda XR 28 mg daily.  14. Synthroid 0.05 mg daily.  15. Keflex 250 p.o. b.i.d. for 2 more days.   ____________________________ [name]. Allena KatzPatel, MD sap:cb D: 09/11/2012 14:28:57 ET T: 09/11/2012 15:59:06 ET JOB#: [id number]  cc: [name]. Allena KatzPatel, MD, <Dictator> [name] Chrisean Kloth MD ELECTRONICALLY SIGNED 09/19/2012 7:04

## 2014-12-03 NOTE — Discharge Summary (Signed)
PATIENT NAME:  Patrick Patrick [name] Patrick [name], Patrick Patrick [name] Patrick [name] MR#:  [id number] DATE OF BIRTH:  12/08/1930  DATE OF ADMISSION:  09/07/2012 DATE OF DISCHARGE:  09/11/2012  PRESENTING COMPLAINT:  Urinary retention with diarrhea and abdominal pain.   DISCHARGE DIAGNOSES: 1.  Acute urinary retention, resolved after placement of Foley by urology, likely secondary to meatal stenosis, improved, Foley removed.  The patient voiding well.  2.  Diarrhea, resolved.  3.  Urinary tract infection.  4.  Advanced dementia.  5.  Depression.  6.  Coronary artery disease status post coronary artery bypass graft in the past.   CONDITION ON DISCHARGE:  Fair.   VITAL SIGNS:  Stable.  Blood pressure is 107/62, sat is 96% on room air, pulse 93.  The patient is afebrile.   CODE STATUS:  NO CODE, DO NOT RESUSCITATE.   LABORATORY DATA AT DISCHARGE:  White count is 8.4, H and H is 10.6 and 31.1, platelet count is 229, glucose is 84, BUN 30, creatinine 1.1, sodium 142, potassium is 3.5.  Echo Doppler showed mild tricuspid regurgitation.  Left ventricle is moderately dilated.  No thrombus.  Left ventricular systolic function is severely reduced.  EF of 25% to 35%.  There is normal left ventricular wall thickness.  Severe global hypokinesis of the left ventricle.  Right ventricular systolic function is mildly reduced.  Mild to moderate mitral regurgitation.  TSH is 0.852.  Urine culture negative in 18 to 24 hours.  Blood culture is negative.   DISCHARGE MEDICATIONS: 1.  Zofran 4 mg q. 4 as needed.  2.  Remeron 15 mg at bedtime.  3.  Magnesium oxide 400 twice daily.  4.  Keflex 250 mg by mouth twice Patrick [name] day for two more days.  5.  Levothyroxine 0.05 mg by mouth daily.  6.  Namenda 10 mg daily.  7.  Nystatin, apply to affected area twice daily for three days.  8.  Ultram 50 mg orally q. 6 as needed.  9.  Aspirin 81 mg daily.  10.  Magnesium -Al plus 30 mL q. 4 as needed.  11.  Tylenol 650 mg by mouth q. 4 as needed.   CONSULTATIONS: 1.  Dr. Harvie Patrick [name],  palliative care.  2.  Physical therapy.  3.  Cardiology, Dr. Juliann Patrick [name].  4.  Dr. Alyce Patrick [name], urology.   BRIEF SUMMARY OF HOSPITAL COURSE:  The patient is Patrick [name] 81 year old gentleman from [name] with advanced dementia, comes in with:  1.  Abdominal pain which was suspected due to acute urinary retention.  The patient was seen by urology, Dr. Alyce Patrick [name] who underwent placement of Foley catheter.  The patient was suspected to have meatal stenosis.  Reviewed urology note.  It was okay to discontinue Foley when not medically needed.  Foley was discontinued.  The patient was voiding okay.  The patient's abdominal pain resolved.  2.  Acute gastroenteritis, resolved.  The patient was Patrick [name] little dehydrated with some hypotension, improved with IV fluids.  3.  Depression.  We discontinued Effexor since the patient was losing weight.  He was started on Remeron as suggested by Dr. Harvie Patrick [name] from palliative care.  4.  Elevated troponin, likely due to dehydration and stress, trending down.  No further work-up per cardiology.  5.  Peripheral vascular disease with carotid stenosis.  The patient is doing well.  He is on aspirin.  6.  Physical therapy saw patient.  No skilled PT needed.  The patient will go back to assisted living facility which is [facility] Patrick [name] with  hospice.  Hospital stay otherwise remained stable.    CODE STATUS:  THE PATIENT REMAINED Patrick [name] NO CODE, DO NOT RESUSCITATE.   TIME SPENT:  Forty minutes.      ____________________________ Patrick Patrick [name] Patrick [name] Patrick Katz, MD sap:ea D: 09/11/2012 13:01:11 ET T: 09/12/2012 02:33:59 ET JOB#: [id number]  cc: Patrick Patrick [name] Patrick [name] Patrick [name]. Patrick Katz, MD, <Dictator> Dr. Alyce Pagan Patrick Patrick [name] Patrick [name] Dr. Val Riles MD ELECTRONICALLY SIGNED 09/19/2012 7:05

## 2014-12-03 NOTE — Consult Note (Signed)
Reason for consultation:  meatal stenosis  81 yo male admitted for dehydration, elevated troponin and elevated WBC.  Unable to get reliable history from patient.  He said he had urology surgery in the past but when asked specifically about bladder, urethra or prostate cancer, he denied previous surgery or history.  voids into diapers unable to obtain from patientprevious H&P 1. History of myocardial infarction.  2. Coronary artery disease.  Cerebrovascular accident, apparently small basal dementia.  Dyslipidemia, not able to tolerate statins.  Previous psychotic episodes.  Depression.  Gastroesophageal reflux disease.  Seasonal allergies.  Mild constipation.  Right arm cancer.  Rectal cancer, now resolved (although patient denies history of rectal cancer when asked) from discharge 05/2012 1. Aspirin 81 mg oral once a day.  2. Celexa 10 mg oral once a day.  Omeprazole 20 mg oral once a day.  Loratadine 10 mg oral once a day.  Docusate sodium 100 mg oral once a day as needed for constipation.  Acetaminophen/oxycodone 325/5 one tablet every six hours as needed for pain.  Levothyroxine 50 mcg oral once a day.  Hydroxyzine 1 tablet oral every eight hours as needed for anxiety.   Allergies: Positive allergies to Lipitor, Zocor with muscle weakness and penicillin with rash.  Social History: Never married, no children.  Was in the army and traveled.ETOH or tobacco usein skilled nursing facility unable to obtain .  Pulse Pulse : 88  Respirations Respirations : 17  SBP SBP : 95  DBP DBP : 58  Pulse Ox % Pulse Ox % : 95 % Pulse Ox Source Source : Room Air Pain Scale (0-10) Pain Scale (0-10) : Unable to give score NAD, cachectic elderly male unlabored breathing, large sternal scarRRRsoft, NT/ND, nonpalpable bladderpenile adhesions circumferential around glans.  pinpoint meatus with changes on glans consistent with lichen sclerosuslesions on phallustesticles palpated, no massesdeferred WBC 21421.65  The penis was  prepped with betadine and draped in sterile fashion. Lidocaine jelly was placed around the meatus.  Using Amplatz urethral dilators, I was able to dilate the meatus from an 8 Fr to a 16 Fr.  A 29F coude catheter was then placed into the bladder and the balloon was filled with 10cc of sterile water.  Dark urine was obtained for sample.  The catheter was attached to a drainage bag.  The patient tolerated the procedure without any difficulty.  81 yo male with sever meatal stenosis.  Catheter placed after meatal dilation.  Recommend keeping catheter as long as primary team needs to monitor I/O's.  apply clobetasol cream near meatus 2-3x/day to help prevent recurrent stenosis.  No further urologic follow up needed.   Electronic Signatures: Janit BernMcNamara, Trung Wenzl R (MD)  (Signed on 26-Jan-14 16:19)  Authored  Last Updated: 26-Jan-14 16:19 by Janit BernMcNamara, Jerri Glauser R (MD)

## 2014-12-05 NOTE — Consult Note (Signed)
Pt is psychiatrically cleared to live on his own.has the capacity for informed consent. Full Note to follow.  Electronic Signatures: Lester CarolinaWilliford, Caren Garske S (MD) (Signed on 26-Jun-13 16:54)  Authored   Last Updated: 26-Jun-13 16:55 by Lester CarolinaWilliford, Kalaysia Demonbreun S (MD)

## 2014-12-05 NOTE — Consult Note (Signed)
PATIENT NAME:  Patrick [name], Patrick [name] MR#:  [id number] DATE OF BIRTH:  12/08/1930  DATE OF CONSULTATION:  02/06/2012  REFERRING PHYSICIAN:  Dr. Lorenso Courier  CONSULTING PHYSICIAN:  Adelene Amas. Lasheena Frieze, MD  REASON FOR CONSULTATION: Mental status changes, assess ability for self care.    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 Patrick [name] lives in an independent apartment complex. The woman for whom he was power-of-attorney became progressively mentally disabled. This required him to make a decision to have her put into placement. This was very stressful on him.   The Emergency Department report regarding yesterday when he first arrived describes Patrick [name] smiling and stating that he was dead. He then went on to say that he was alive and going to [place] after he goes to the afterlife. There was some concern that he was delusional when he stated that he played softball, however, this was confirmed that softball games do occur in his area and he participates in them.   The Mobile Crisis staff person, Patrick [name], took out IVC papers on the patient due to concern about his judgment.   There has been concern about hyperreligiosity. His particular denomination believes in angel visitation and other phenomena that are in the [employer]. They believe that those phenomena occur today. Therefore, this presentation regarding the statements below is not unusual for his denomination. He stated that 70 angels had visited him. He also stated that he can sense when God is leading him.   He did state that he had won 10 million dollars in the [employer] and was going to receive 7000 dollars per week. There was no way to verify this, however, it seemed extremely unlikely. There is no data on specific memory orientation questioning and examination yesterday.   However, today the undersigned has thoroughly reported his mental status as below. So far today  he is not talking about winning money in the [employer]. He does continue with his standard full gospel religious beliefs. He demonstrates appropriate judgment when he describes his activities of daily living and activities that are necessary for self care. He does go to [employer] often. He does have a lady friend who helps him with meals and groceries as well. He describes normal interests, mood, appetite, hope. He has no thoughts of harming himself or others. He has no hallucinations or delusions. His orientation and memory function are intact. He has clouding of consciousness. He has no thought disorganization or agitation.   When asked how he tolerated the stay in the Emergency Department, he stated that he understood that this was necessary for evaluation and that God was giving him strength to endure it. Again, this is not unusual for his denomination to make this kind of statement.   PAST PSYCHIATRIC HISTORY: There is complete denial by the patient for any history of depression, suicide attempts, psychotic episodes, experiences of that of religion which is outside of his full gospel denomination.   He denies any history of racing thoughts, increased goal-directed activity, increased energy. He has never been on psychotropic medication. He has never been psychiatrically hospitalized.   FAMILY PSYCHIATRIC HISTORY: None known.   SOCIAL HISTORY: Please see the above. Patrick [name] has never been married. He has no children. He does not use alcohol or illegal drugs.   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  PAST MEDICAL HISTORY: Hypertension.   ALLERGIES: Zocor, Lipitor, penicillin.   MEDICATIONS:  1. Colestipol 1 gram 6 tablets twice daily.  2. Dipyridamole 75 mg b.i.d.  3. Lisinopril 5 mg daily.  4. Multivitamin daily. 5. Tylenol PM at bedtime.  6. Niacin extended-release daily.   LABORATORY DATA: SGOT 47.   Head CT, TSH,  alcohol, SGPT, WBC, hemoglobin all unremarkable.   Urine drug screen, aspirin, and Tylenol unremarkable.   REVIEW OF SYSTEMS: Constitutional, HEENT, mouth, neurologic, psychiatric, cardiovascular, respiratory, gastrointestinal, genitourinary, skin, musculoskeletal, hematologic, lymphatic, endocrine, metabolic all unremarkable.   PHYSICAL EXAMINATION:   VITAL SIGNS: Temperature 96.6, pulse 61, respiratory rate 20, blood pressure 137/63.   GENERAL APPEARANCE: Patrick [name] is a well developed, well nourished elderly male lying in a partially reclined supine position on his hospital gurney with no abnormal involuntary movement. He has no cachexia. His muscle tone is normal. His grooming and hygiene are normal.   MENTAL STATUS EXAMINATION: Patrick [name] is alert. His eye contact is good. Concentration is normal. His abstraction ability is intact. His orientation is intact completely to the year, month, day of the month, day of week, place and person. He gives the answers to these questions quickly. His memory is intact to immediate, recent, and remote including 3 out of 3 objects immediate and 3 out of 3 objects on recall. Again, he gives the answers to these questions sharply and quickly but without increased rate of thought speed. His intelligence, use of language, and fund of knowledge are normal. His speech involves normal rate and prosody without dysarthria. Thought process is logical, coherent, and goal directed. No looseness of associations or tangents. Thought content no thoughts of harming himself or others. No delusions or hallucinations. Insight is intact. Judgment is intact.   Affect is broad and appropriate. Mood is within normal limits.   ASSESSMENT:  AXIS I: Delirium, not otherwise specified. It does appear that Patrick [name] did go through a period of mental status aberration yesterday. This may have been due to the age of his central nervous system combined with the heat and possibly the stress of  being a power-of-attorney.   Today he speaks of the power-of-attorney role in a matter of fact manner. He wouldn't have taken it on without understanding the obligations that it entails. He does not appear to be inappropriate for his power-of-attorney role.   Please see further discussion below.   AXIS II: None.   AXIS III:  1. Hypertension.  2. See past medical history and medications.   AXIS IV: General medical, primary support group.   AXIS V: 55.   Patrick [name] is not at risk to harm himself or others. He agrees to call emergency services immediately for any thoughts of harming himself, thoughts of harming others, or distress.  Patrick [name] does demonstrate the ability to make a consistent choice. He demonstrates the ability to differentiate between his options in his lifestyle as well as risks versus benefits of different lifestyles. He understands his risk of morbidity and mortality regarding his general medical problems. He does not demonstrate any depression or other condition that would alter his motivation for living. He does reason well.   Patrick [name] does have the capacity for informed consent as well as the capacity to leave the hospital and resume his normal life.   No psychiatric treatment is indicated. However, would encourage Patrick [name] to avoid excessive heat and to continue with his diet as recommended by his general medical care  providers which includes appropriate hydration.   ____________________________ Adelene Amas. Quashawn Jewkes, MD [id number] D: 02/07/2012 00:05:36 ET T: 02/07/2012 06:04:43 ET JOB#: [id number]  cc: Adelene Amas. Taytum Scheck, MD, <Dictator> Lester [place] MD ELECTRONICALLY SIGNED 02/07/2012 9:53
